# Patient Record
Sex: Male | Born: 1980 | Race: White | Hispanic: No | Marital: Single | Smoking: Never smoker
Health system: Southern US, Academic
[De-identification: ages and names within clinical notes are randomized; demographics above are authoritative.]

## PROBLEM LIST (undated history)

## (undated) DIAGNOSIS — I10 Essential (primary) hypertension: Secondary | ICD-10-CM

## (undated) HISTORY — DX: Essential (primary) hypertension: I10

---
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
## 2022-11-04 ENCOUNTER — Other Ambulatory Visit: Payer: Self-pay

## 2022-11-04 ENCOUNTER — Emergency Department
Admission: EM | Admit: 2022-11-04 | Discharge: 2022-11-04 | Disposition: A | Discharge status: Home / Self Care | Payer: BC Managed Care – PPO | Attending: NURSE PRACTITIONER | Admitting: NURSE PRACTITIONER

## 2022-11-04 ENCOUNTER — Encounter (HOSPITAL_COMMUNITY): Payer: Self-pay

## 2022-11-04 ENCOUNTER — Emergency Department (HOSPITAL_COMMUNITY): Payer: BC Managed Care – PPO

## 2022-11-04 DIAGNOSIS — R109 Unspecified abdominal pain: Secondary | ICD-10-CM | POA: Insufficient documentation

## 2022-11-04 DIAGNOSIS — R1031 Right lower quadrant pain: Secondary | ICD-10-CM

## 2022-11-04 DIAGNOSIS — I10 Essential (primary) hypertension: Secondary | ICD-10-CM | POA: Insufficient documentation

## 2022-11-04 DIAGNOSIS — R1011 Right upper quadrant pain: Secondary | ICD-10-CM

## 2022-11-04 LAB — LACTIC ACID LEVEL W/ REFLEX FOR LEVEL >2.0: LACTIC ACID: 1.4 mmol/L (ref 0.5–2.2)

## 2022-11-04 LAB — BASIC METABOLIC PANEL
ANION GAP: 10 mmol/L (ref 4–13)
BUN/CREA RATIO: 16 (ref 6–22)
BUN: 13 mg/dL (ref 7–25)
CALCIUM: 8.9 mg/dL (ref 8.6–10.3)
CHLORIDE: 105 mmol/L (ref 98–107)
CO2 TOTAL: 24 mmol/L (ref 21–31)
CREATININE: 0.82 mg/dL (ref 0.60–1.30)
ESTIMATED GFR: 113 mL/min/{1.73_m2} (ref >59)
GLUCOSE: 90 mg/dL (ref 74–109)
OSMOLALITY, CALCULATED: 277 mOsm/kg (ref 270–290)
POTASSIUM: 3.9 mmol/L (ref 3.5–5.1)
SODIUM: 139 mmol/L (ref 136–145)

## 2022-11-04 LAB — BLUE TOP TUBE

## 2022-11-04 LAB — CBC WITH DIFF
BASOPHIL #: 0 10*3/uL (ref 0.00–0.10)
BASOPHIL %: 1 % (ref 0–1)
EOSINOPHIL #: 0.4 10*3/uL (ref 0.00–0.50)
EOSINOPHIL %: 4 %
HCT: 41.1 % (ref 36.7–47.1)
HGB: 14.2 g/dL (ref 12.5–16.3)
LYMPHOCYTE #: 4.2 10*3/uL — ABNORMAL HIGH (ref 1.00–3.00)
LYMPHOCYTE %: 42 % (ref 16–44)
MCH: 30.8 pg (ref 23.8–33.4)
MCHC: 34.7 g/dL (ref 32.5–36.3)
MCV: 88.9 fL (ref 73.0–96.2)
MONOCYTE #: 0.9 10*3/uL (ref 0.30–1.00)
MONOCYTE %: 9 % (ref 5–13)
MPV: 8.3 fL (ref 7.4–11.4)
NEUTROPHIL #: 4.6 10*3/uL (ref 1.85–7.80)
NEUTROPHIL %: 45 % (ref 43–77)
PLATELETS: 301 10*3/uL (ref 140–440)
RBC: 4.62 10*6/uL (ref 4.06–5.63)
RDW: 14.3 % (ref 12.1–16.2)
WBC: 10 10*3/uL (ref 3.6–10.2)

## 2022-11-04 LAB — HEPATIC FUNCTION PANEL
ALBUMIN/GLOBULIN RATIO: 1.3 (ref 0.8–1.4)
ALBUMIN: 4.3 g/dL (ref 3.5–5.7)
ALKALINE PHOSPHATASE: 52 U/L (ref 34–104)
ALT (SGPT): 18 U/L (ref 7–52)
AST (SGOT): 14 U/L (ref 13–39)
BILIRUBIN DIRECT: 0.06 md/dL (ref <=0.20)
BILIRUBIN TOTAL: 0.3 mg/dL (ref 0.3–1.2)
BILIRUBIN, INDIRECT: 0.24 mg/dL (ref <=1)
GLOBULIN: 3.2 (ref 2.9–5.4)
PROTEIN TOTAL: 7.5 g/dL (ref 6.4–8.9)

## 2022-11-04 LAB — GRAY TOP TUBE

## 2022-11-04 LAB — LIPASE: LIPASE: 32 U/L (ref 11–82)

## 2022-11-04 MED ORDER — SODIUM CHLORIDE 0.9 % (FLUSH) INJECTION SYRINGE
3.0000 mL | INJECTION | INTRAMUSCULAR | Status: DC | PRN
Start: 2022-11-04 — End: 2022-11-04

## 2022-11-04 MED ORDER — IOHEXOL 350 MG IODINE/ML INTRAVENOUS SOLUTION
100.0000 mL | INTRAVENOUS | Status: AC
Start: 2022-11-04 — End: 2022-11-04
  Administered 2022-11-04: 100 mL via INTRAVENOUS

## 2022-11-04 MED ORDER — SODIUM CHLORIDE 0.9 % (FLUSH) INJECTION SYRINGE
3.0000 mL | INJECTION | Freq: Three times a day (TID) | INTRAMUSCULAR | Status: DC
Start: 2022-11-04 — End: 2022-11-04

## 2022-11-04 MED ORDER — ONDANSETRON HCL (PF) 4 MG/2 ML INJECTION SOLUTION
INTRAMUSCULAR | Status: AC
Start: 2022-11-04 — End: 2022-11-04
  Filled 2022-11-04: qty 2

## 2022-11-04 MED ORDER — KETOROLAC 30 MG/ML (1 ML) INJECTION SOLUTION
INTRAMUSCULAR | Status: AC
Start: 2022-11-04 — End: 2022-11-04
  Filled 2022-11-04: qty 1

## 2022-11-04 MED ORDER — SODIUM CHLORIDE 0.9 % IV BOLUS
1000.0000 mL | INJECTION | Status: AC
Start: 2022-11-04 — End: 2022-11-04
  Administered 2022-11-04: 1000 mL via INTRAVENOUS
  Administered 2022-11-04: 0 mL via INTRAVENOUS

## 2022-11-04 MED ORDER — ONDANSETRON HCL (PF) 4 MG/2 ML INJECTION SOLUTION
4.0000 mg | INTRAMUSCULAR | Status: AC
Start: 2022-11-04 — End: 2022-11-04
  Administered 2022-11-04: 4 mg via INTRAVENOUS

## 2022-11-04 MED ORDER — KETOROLAC 30 MG/ML (1 ML) INJECTION SOLUTION
30.0000 mg | INTRAMUSCULAR | Status: AC
Start: 2022-11-04 — End: 2022-11-04
  Administered 2022-11-04: 30 mg via INTRAVENOUS

## 2022-11-04 MED ORDER — ONDANSETRON 4 MG DISINTEGRATING TABLET
4.0000 mg | ORAL_TABLET | Freq: Three times a day (TID) | ORAL | 0 refills | Status: AC | PRN
Start: 2022-11-04 — End: ?

## 2022-11-04 NOTE — ED Triage Notes (Signed)
Right mid back and Right flank pain x 1 1/2 weeks increasing tonight, "I did have a gallbladder attack here a couple of years ago", denies vomiting, denies hx of kidney stones

## 2022-11-04 NOTE — ED Nurses Note (Signed)
Presents to ER 10 with complains of right flank pain. He states he has had this pain for about a week but tonight it was worse then it has been. Rates pain 8/10.

## 2022-11-04 NOTE — ED Provider Notes (Signed)
[facility]  Emergency Department  Advanced Practice Provider Note      CHIEF COMPLAINT  Chief Complaint   Patient presents with    Flank Pain     HISTORY OF PRESENT ILLNESS  Edwin Byrd, date of birth 1980-08-20, is a 42 y.o. male who presented to the Emergency Department.    Patient is a 42 year old male, with a history of hypertension, who presents to the ER with complaint of right flank pain for the last week but worsening yesterday.  Patient also reporting some nausea but denies any vomiting or diarrhea.  Denies any recent fever, chills.  Denies any dysuria or hematuria.  Patient describes pain as sharp and burning.  Denies any exacerbating or relieving factors.  Rates pain an 8/10.  States he was seen by his PCP earlier this week and told that he had a back strain and was prescribed muscle relaxers which have not helped.    PAST MEDICAL/SURGICAL/FAMILY/SOCIAL HISTORY  Past Medical History:   Diagnosis Date    HTN (hypertension)        Family Medical History:    None       Social History     Socioeconomic History    Marital status: Single   Tobacco Use    Smoking status: Never    Smokeless tobacco: Never   Substance and Sexual Activity    Alcohol use: Never    Drug use: Never      ALLERGIES  No Known Allergies        PHYSICAL EXAM  VITAL SIGNS:  Filed Vitals:    11/04/22 0024   BP: 131/89   Pulse: 82   Resp: 18   Temp: 36.3 C (97.4 F)   SpO2: 97%     Constitutional: Awake. Well appearing. Average body weight. No distress noted.   Cardiovascular: Regular rate. S1, S2 with no murmur or gallop heard. No swelling to extremities  Pulmonary/Chest: Breath sounds clear and equal bilaterally. No wheezes, rales or chest tenderness. No respiratory distress.   Abdominal: Bowel sound normal. Abdomen soft, epigastric/RUQ/RLQ tenderness without guarding.  No CVA tenderness             Musculoskeletal: No tenderness or deformity. Normal muscle tone and strength.   Skin: warm and dry. No rash, redness,  or bruising  Psychiatric: normal mood and affect. Behavior is normal.   Neurological: Alert, oriented. Normal gait. No focal weakness noted. No sensory deficit    Nursing notes reviewed.     DIAGNOSTICS  Labs:  Labs listed below were reviewed and interpreted by me.  Results for orders placed or performed during the hospital encounter of 11/04/22   BASIC METABOLIC PANEL   Result Value Ref Range    SODIUM 139 136 - 145 mmol/L    POTASSIUM 3.9 3.5 - 5.1 mmol/L    CHLORIDE 105 98 - 107 mmol/L    CO2 TOTAL 24 21 - 31 mmol/L    ANION GAP 10 4 - 13 mmol/L    CALCIUM 8.9 8.6 - 10.3 mg/dL    GLUCOSE 90 74 - 956 mg/dL    BUN 13 7 - 25 mg/dL    CREATININE 2.13 0.86 - 1.30 mg/dL    BUN/CREA RATIO 16 6 - 22    ESTIMATED GFR 113 >59 mL/min/1.70m^2    OSMOLALITY, CALCULATED 277 270 - 290 mOsm/kg   HEPATIC FUNCTION PANEL   Result Value Ref Range    ALBUMIN 4.3 3.5 - 5.7 g/dL  ALKALINE PHOSPHATASE 52 34 - 104 U/L    ALT (SGPT) 18 7 - 52 U/L    AST (SGOT) 14 13 - 39 U/L    BILIRUBIN TOTAL 0.3 0.3 - 1.2 mg/dL    BILIRUBIN, INDIRECT 0.24 <=1 mg/dL    PROTEIN TOTAL 7.5 6.4 - 8.9 g/dL    GLOBULIN 3.2 2.9 - 5.4    ALBUMIN/GLOBULIN RATIO 1.3 0.8 - 1.4    BILIRUBIN DIRECT 0.06 <=0.20 md/dL   LIPASE   Result Value Ref Range    LIPASE 32 11 - 82 U/L   CBC WITH DIFF   Result Value Ref Range    WBC 10.0 3.6 - 10.2 x10^3/uL    RBC 4.62 4.06 - 5.63 x10^6/uL    HGB 14.2 12.5 - 16.3 g/dL    HCT 78.4 69.6 - 29.5 %    MCV 88.9 73.0 - 96.2 fL    MCH 30.8 23.8 - 33.4 pg    MCHC 34.7 32.5 - 36.3 g/dL    RDW 28.4 13.2 - 44.0 %    PLATELETS 301 140 - 440 x10^3/uL    MPV 8.3 7.4 - 11.4 fL    NEUTROPHIL % 45 43 - 77 %    LYMPHOCYTE % 42 16 - 44 %    MONOCYTE % 9 5 - 13 %    EOSINOPHIL % 4 %    BASOPHIL % 1 0 - 1 %    NEUTROPHIL # 4.60 1.85 - 7.80 x10^3/uL    LYMPHOCYTE # 4.20 (H) 1.00 - 3.00 x10^3/uL    MONOCYTE # 0.90 0.30 - 1.00 x10^3/uL    EOSINOPHIL # 0.40 0.00 - 0.50 x10^3/uL    BASOPHIL # 0.00 0.00 - 0.10 x10^3/uL   LACTIC ACID LEVEL W/ REFLEX FOR  LEVEL >2.0   Result Value Ref Range    LACTIC ACID 1.4 0.5 - 2.2 mmol/L     Radiology:  Results for orders placed or performed during the hospital encounter of 11/04/22   CT ABDOMEN PELVIS W IV CONTRAST     Status: None    Narrative    Edwin Byrd    RADIOLOGIST: Alvester Chou, MD    CT ABDOMEN PELVIS W IV CONTRAST performed on 11/04/2022 1:47 AM    CLINICAL HISTORY: R flank pain. Nausea.  ruq abd pain    TECHNIQUE:  Abdomen and pelvis CT with intravenous contrast.  IV CONTRAST: 75 ml's of Omnipaque 350    COMPARISON:  04/05/2018    FINDINGS:  Lung bases: Clear    Liver:   Unremarkable.    Gallbladder:   Unremarkable.    Spleen:   Unremarkable.    Pancreas:   Unremarkable.    Adrenals:   Unremarkable.    Kidneys:   Unremarkable.    Bladder:  Unremarkable.  Prostate:  Unremarkable.    Bowel:   Unremarkable.    Appendix:  Normal.    Lymph nodes:  No suspicious lymph node enlargement.    Vasculature:   Major vascular structures are unremarkable.     Peritoneum / Retroperitoneum: No ascites.  No free air.    Bones:   Unremarkable.  Bilateral fat-containing inguinal hernias        Impression    NO ACUTE FINDINGS AT THE ABDOMEN OR PELVIS ON CONTRAST-ENHANCED CT.       One or more dose reduction techniques were used (e.g., Automated exposure control, adjustment of the mA and/or kV according to patient size, use of iterative  reconstruction technique).      Radiologist location ID: [id number]         ED COURSE/MEDICAL DECISION MAKING      ED Course as of 11/04/22 0200   Fri Nov 04, 2022   0107 WBC: 10.0  Normal   0121 BASIC METABOLIC PANEL  Normal   0121 HEPATIC FUNCTION PANEL  Normal   0121 LIPASE: 32  Normal   0133 LACTIC ACID: 1.4  Normal   0159 CT ABDOMEN PELVIS W IV CONTRAST  IMPRESSION:  NO ACUTE FINDINGS AT THE ABDOMEN OR PELVIS ON CONTRAST-ENHANCED CT.         Medical Decision Making  Patient is a 42 year old male, with a history of hypertension, who presents to the ER with complaint of right flank pain for  the last week but worsening yesterday.  Patient also reporting some nausea but denies any vomiting or diarrhea.  Denies any recent fever, chills.  Denies any dysuria or hematuria.  Patient describes pain as sharp and burning.  Denies any exacerbating or relieving factors.  Rates pain an 8/10.  States he was seen by his PCP earlier this week and told that he had a back strain and was prescribed muscle relaxers which have not helped.    Differential dx includes but is not limited to: renal calculi, UTI, pyelonephritis, cholecystitis, appendicitis, pancreatitis    Patient's CBC is unremarkable.  Patient's BNP is unremarkable.  Patient's hepatic function panel is unremarkable.  Patient's lipase is normal.  Patient's CT of the abdomen pelvis shows no acute findings.    Patient reports pain and nausea has improved. Pain has decreased to a 5 or 6. Patient will be discharged with a prescription for Zofran to use as needed.  Patient encouraged to follow up with his PCP tomorrow.  Given strict return to ED precautions    Following the above history, physical exam, and findings- the patient was deemed stable and suitable for discharge.  Discharge and medication instructions were discussed with the patient and all questions were addressed.  The patient understands that they may return to the ED at any time for new or worsening symptoms or if they have any other concerns.  The patient is to follow up with PCP . Patient verbalized understanding and agrees to plan of care     Amount and/or Complexity of Data Reviewed  Radiology: ordered. Decision-making details documented in ED Course.  ECG/medicine tests: independent interpretation performed.    Risk  Prescription drug management.      CLINICAL IMPRESSION  Clinical Impression   Right flank pain (Primary)   Hypertension, unspecified type     DISPOSITION  Discharged       DISCHARGE MEDICATIONS  Current Discharge Medication List        START taking these medications    Details    ondansetron (ZOFRAN ODT) 4 mg Oral Tablet, Rapid Dissolve Take 1 Tablet (4 mg total) by mouth Every 8 hours as needed for Nausea/Vomiting  Qty: 12 Tablet, Refills: 0             Sherlie Ban, FNP-C 11/04/2022, 00:55   [facility]  Department of Emergency Medicine  [facility]    This note was partially generated using MModal Fluency Direct system, and there may be some incorrect words, spellings, and punctuation that were not noted in checking the note before saving.    -----

## 2022-11-04 NOTE — ED Nurses Note (Signed)
Patient discharged home with family.  AVS reviewed with patient/care giver.  A written copy of the AVS and discharge instructions was given to the patient/care giver. Scripts handed to patient/care giver. Questions sufficiently answered as needed.  Patient/care giver encouraged to follow up with PCP as indicated.  In the event of an emergency, patient/care giver instructed to call 911 or go to the nearest emergency room.

## 2022-11-04 NOTE — Discharge Instructions (Signed)
Today in the ED, we completed a workup for your complaint of R side pain. The findings on your exam today and on your blood work and/or imaging is reassuring. Your workup did not show any emergent findings requiring hospitalization.  Sometimes, we do not always find the cause for your symptoms in one ER visit.  At this time, it is not 100% certain what is causing your symptoms, but we feel you can be discharged from the Emergency Department.     It is possible this may worsen or get better. Please, if you get worse or your symptoms change, return to ED. Otherwise, we strongly encourage you to follow up with primary care office within 24-48 hours or any of the specialists we have recommended.     Thank you for allowing us to be part of your care.

## 2022-11-09 ENCOUNTER — Inpatient Hospital Stay
Admission: RE | Admit: 2022-11-09 | Discharge: 2022-11-09 | Disposition: A | Discharge status: Home / Self Care | Payer: BC Managed Care – PPO | Source: Ambulatory Visit | Attending: FAMILY PRACTICE | Admitting: FAMILY PRACTICE

## 2022-11-09 ENCOUNTER — Other Ambulatory Visit (HOSPITAL_COMMUNITY): Payer: Self-pay | Admitting: FAMILY PRACTICE

## 2022-11-09 ENCOUNTER — Other Ambulatory Visit: Payer: Self-pay

## 2022-11-09 DIAGNOSIS — S161XXA Strain of muscle, fascia and tendon at neck level, initial encounter: Secondary | ICD-10-CM

## 2022-12-06 IMAGING — MR MRI LUMBAR SPINE WITHOUT CONTRAST
5 of 6 series · 32 of 48 positions shown · IV contrast (gadolinium)
Comparison: None available.

﻿EXAM:  [id number]   MRI LUMBAR SPINE WITHOUT CONTRAST
INDICATION: Persistent low back pain. Radiculopathy to the left side. No history of back surgery or malignancy.
TECHNIQUE: Multiplanar, multisequential MRI of the lumbosacral spine was performed without gadolinium contrast.

[Series 6: T1 · sagittal · 4.0mm · 0.94mm/px · 6 of 13 slices shown (1 of 2)]
[im 1/13]
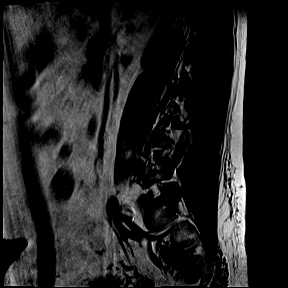
[im 3/13]
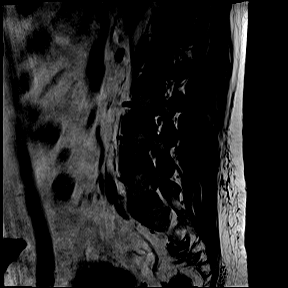
[im 5/13]
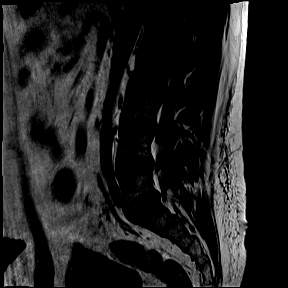
[im 8/13]
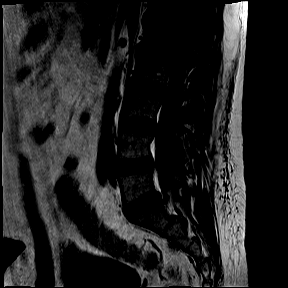
[im 10/13]
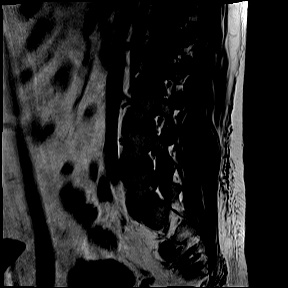
[im 13/13]
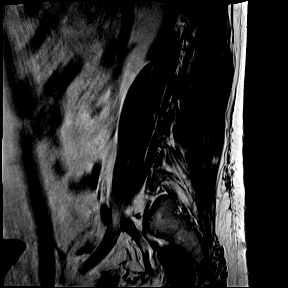

[Series 7: T2 · sagittal · 4.0mm · 0.94mm/px · 6 of 13 slices shown (1 of 3)]
[im 1/13]
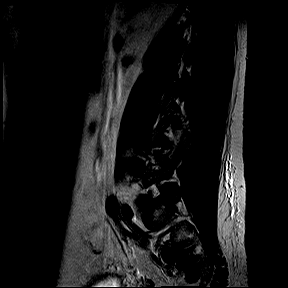
[im 3/13]
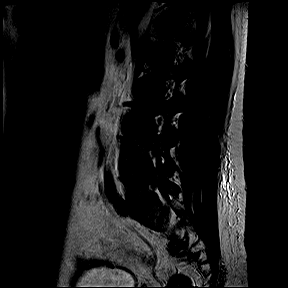
[im 5/13]
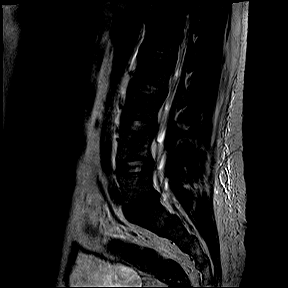
[im 8/13]
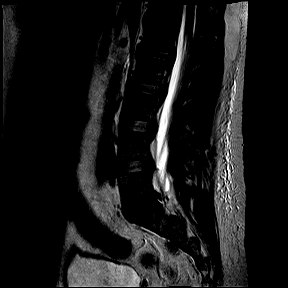
[im 10/13]
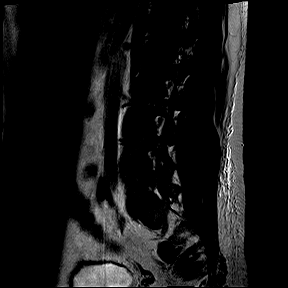
[im 13/13]
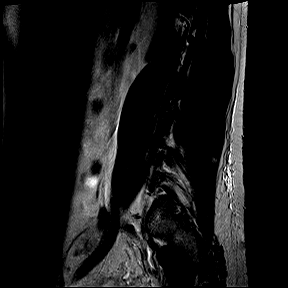

[Series 9: T2 · coronal · 5.0mm · 0.82mm/px · 8 of 18 slices shown (2 of 3)]
[im 1/18]
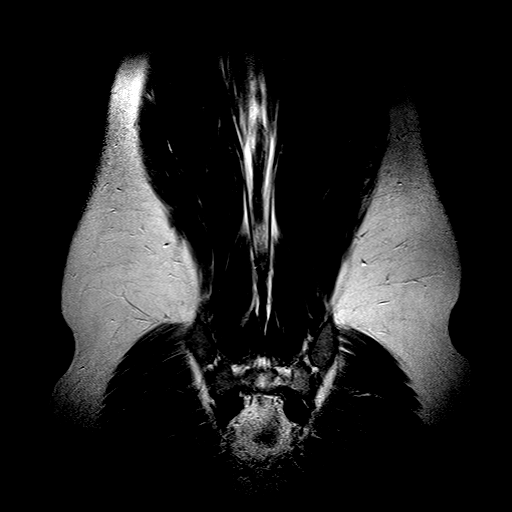
[im 3/18]
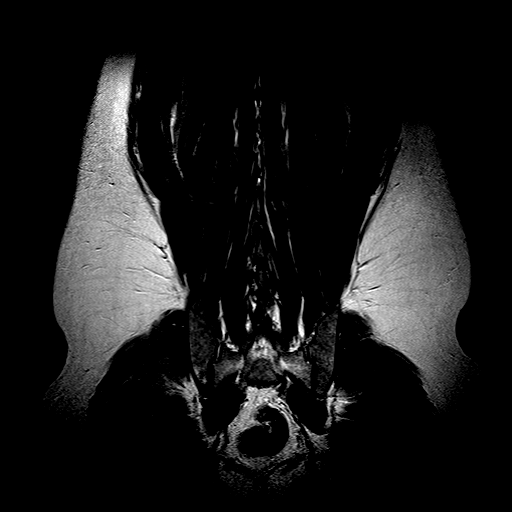
[im 5/18]
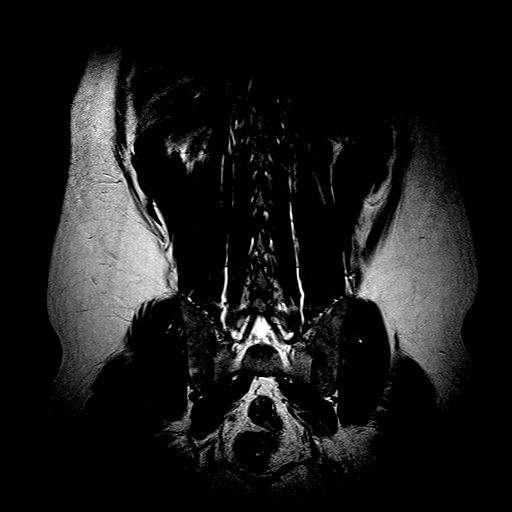
[im 8/18]
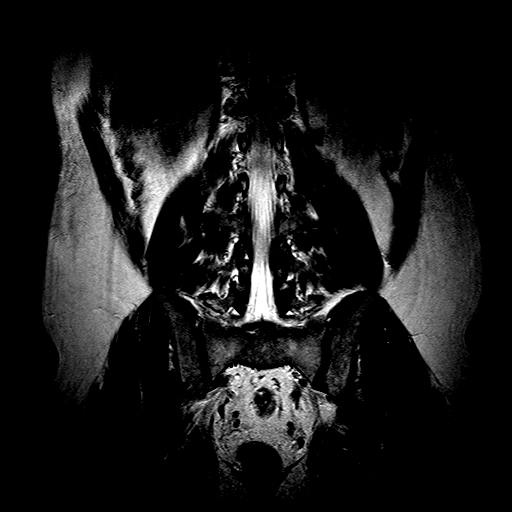
[im 10/18]
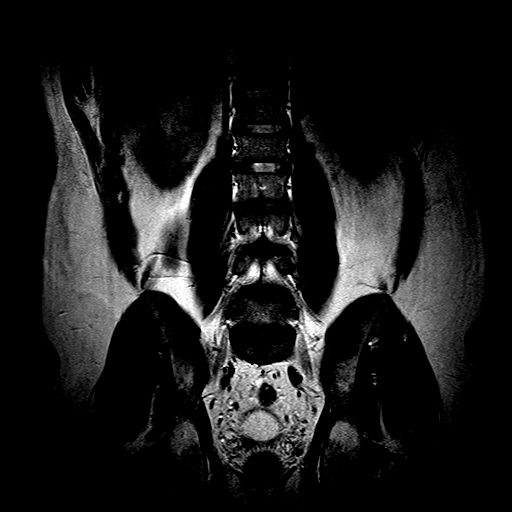
[im 13/18]
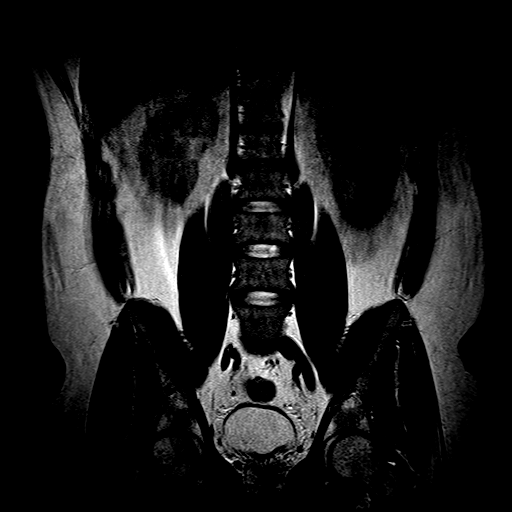
[im 15/18]
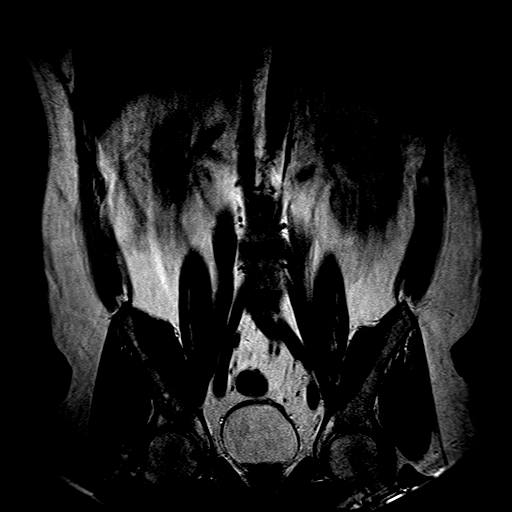
[im 18/18]
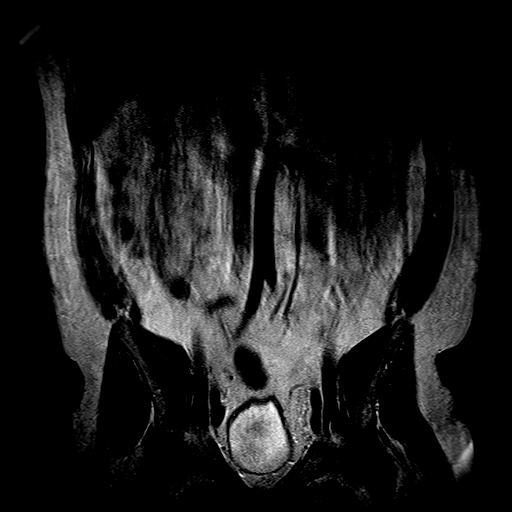

[Series 10: T2 · axial · 4.0mm · 0.52mm/px · z∈[-86,+126]mm · 11 of 23 slices shown (3 of 3)]
[im 1/23]
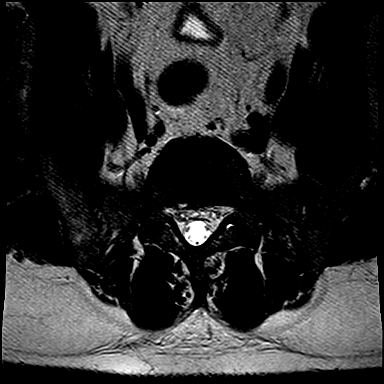
[im 3/23]
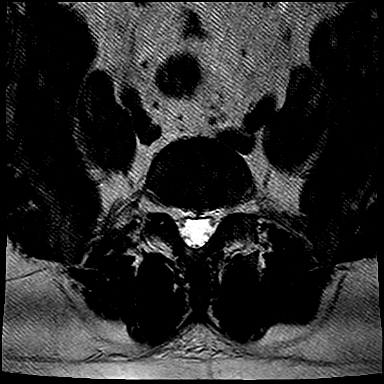
[im 5/23]
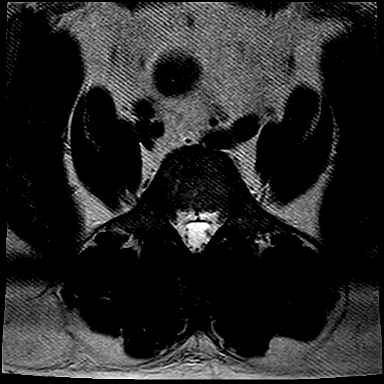
[im 7/23]
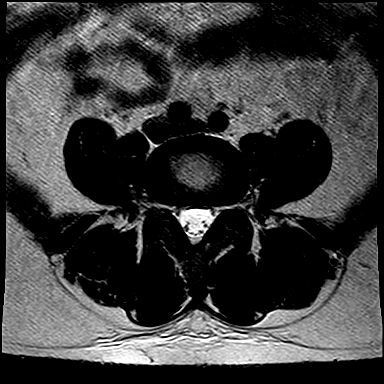
[im 9/23]
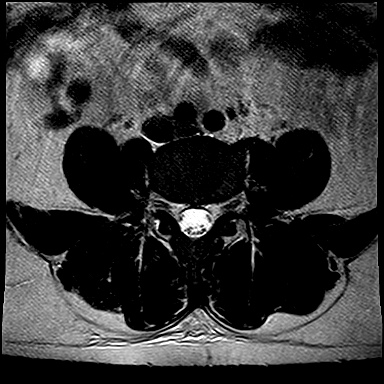
[im 12/23]
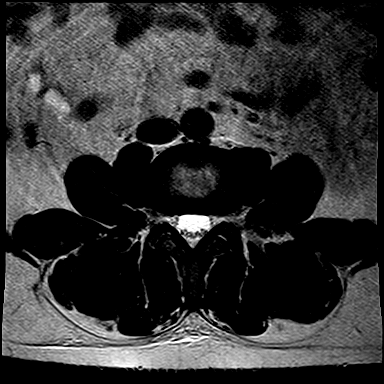
[im 14/23]
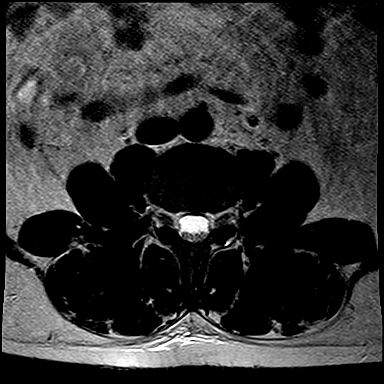
[im 16/23]
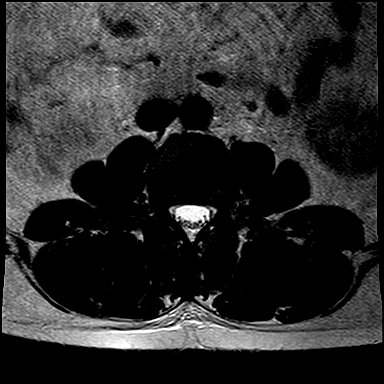
[im 18/23]
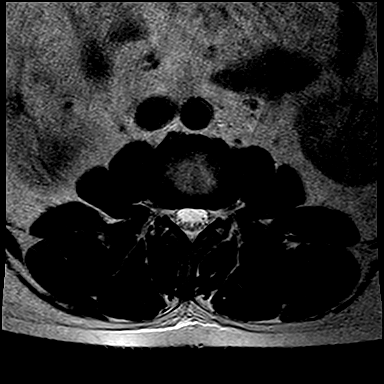
[im 20/23]
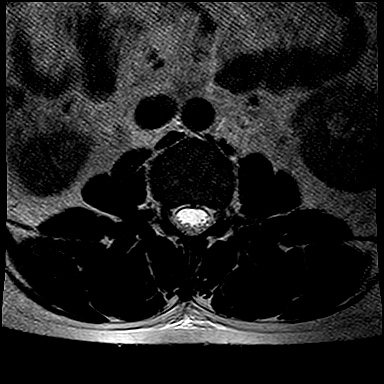
[im 23/23]
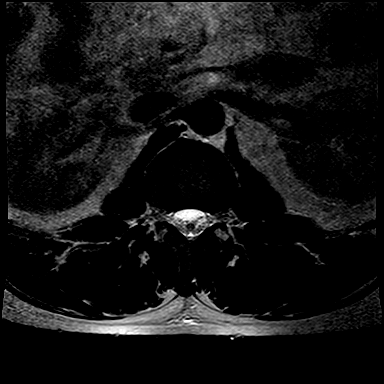

[Series 11: T1 · axial · 4.0mm · 0.52mm/px · 1 of 23 slices shown (2 of 2)]
[im 1/23]
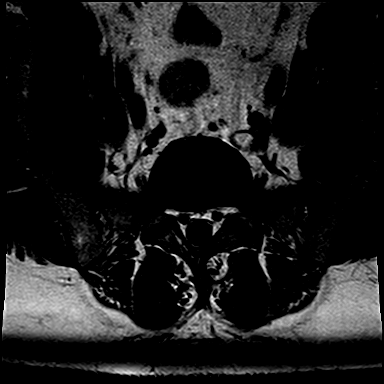

[32 of 48 positions shown; findings below may reference images not displayed]

FINDINGS: No acute bony lesions of lumbar vertebrae.  Conus medullaris and cauda equina structures are normal in the sagittal projection.

L1-2 disc shows no focal abnormalities.  L2-3 disc shows no focal abnormalities.  No significant focal lesions of L3-4 disc. Mild facet arthropathy at this level is noted causing minimal compromise of both lateral recess. 

At L4-5 level, bilateral facet arthropathy of significant degree is noted along with mild bulging annulus causing mild compromise of both lateral recess and neural foramina. 

At L5-S1 level, significant degenerative disc disease with focal bulging annulus and annulus tear in the midline minimally impinging on thecal sac in the midline. Bulging annulus and facet arthropathy are causing moderate biforaminal narrowing at this level, right more than the left.

Paravertebral soft tissues are unremarkable.
IMPRESSION: 1. No acute bony lesions of lumbar vertebrae. 

2. At L5-S1 level, significant degenerative disc disease with focal bulging annulus and annulus tear in the midline minimally impinging on thecal sac in the midline. Bulging annulus and facet arthropathy are causing moderate biforaminal narrowing at this level, right more than the left.

3. Findings at other disc levels are described above in detail.

## 2023-02-02 IMAGING — MR MRI CERVICAL SPINE WITHOUT CONTRAST
4 of 5 series · 23 of 48 positions shown · IV contrast (gadolinium)
Comparison: None available.

﻿EXAM:  [id number]   MRI CERVICAL SPINE WITHOUT CONTRAST
INDICATION: Neck pain.  Radicular symptoms to left shoulder.
TECHNIQUE: Multiplanar, multisequential MRI of the C-spine was performed without gadolinium contrast.

[Series 5: T2 · sagittal · 3.0mm · 0.75mm/px · 8 of 13 slices shown (1 of 2)]
[im 1/13]
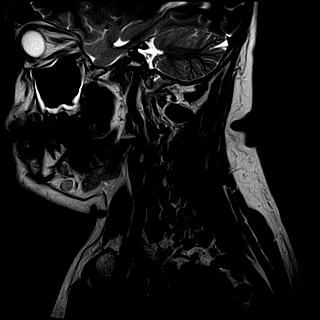
[im 2/13]
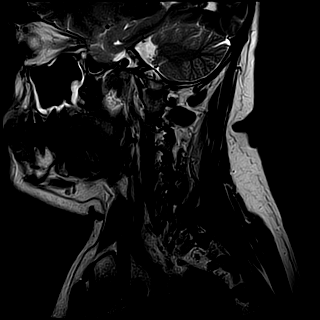
[im 4/13]
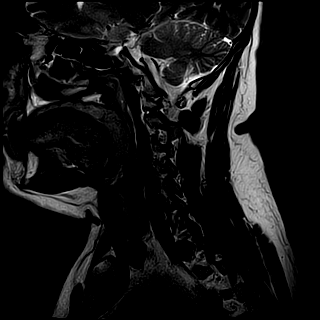
[im 6/13]
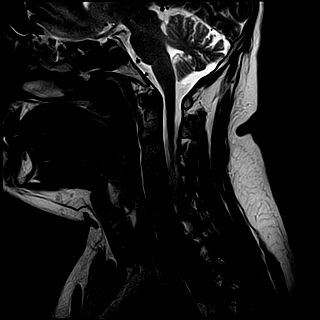
[im 7/13]
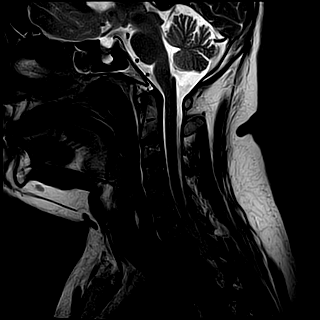
[im 9/13]
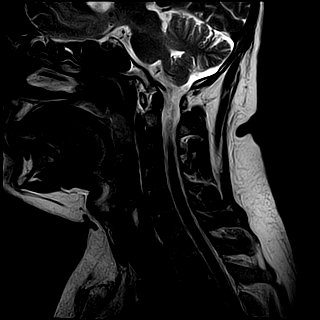
[im 11/13]
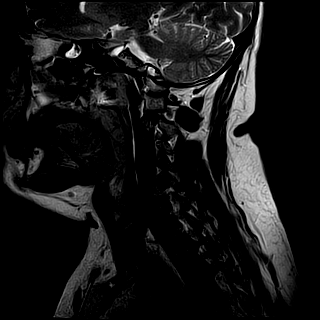
[im 13/13]
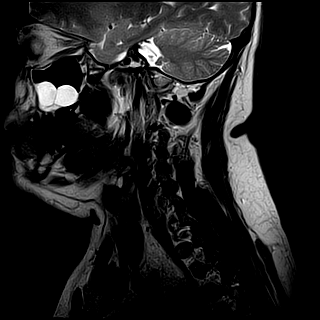

[Series 6: T1 · sagittal · 3.0mm · 0.47mm/px · 3 of 13 slices shown]
[im 2/13]
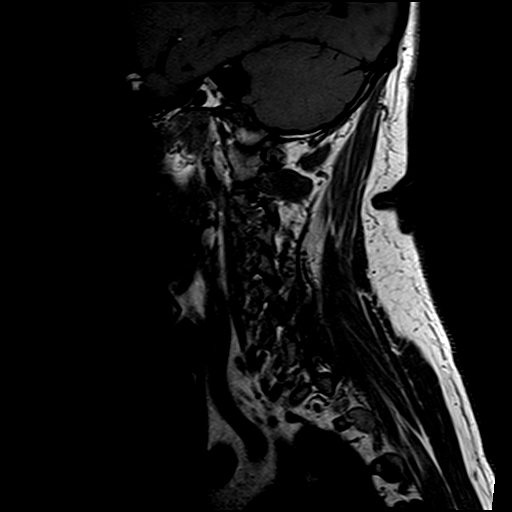
[im 7/13]
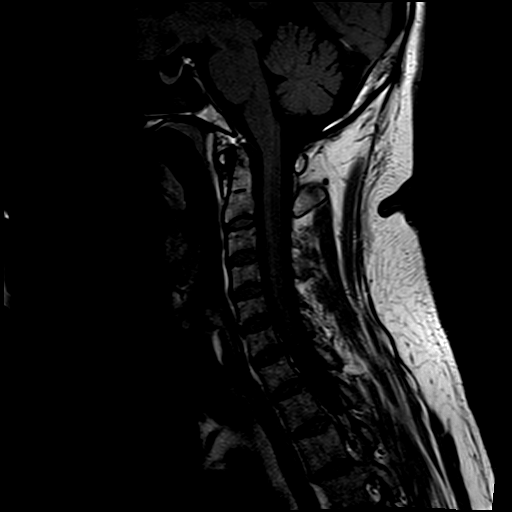
[im 11/13]
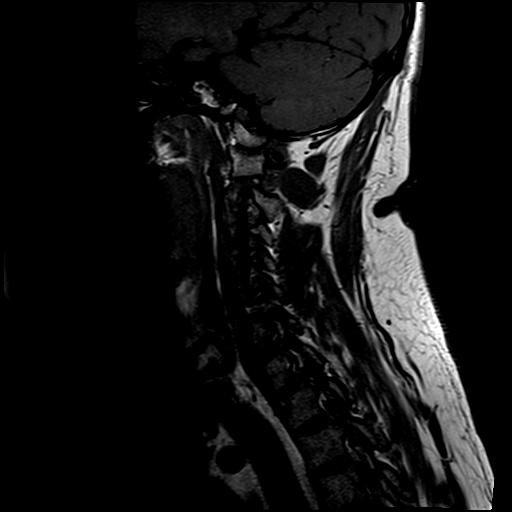

[Series 7: STIR · sagittal · 3.0mm · 0.47mm/px · 3 of 13 slices shown]
[im 2/13]
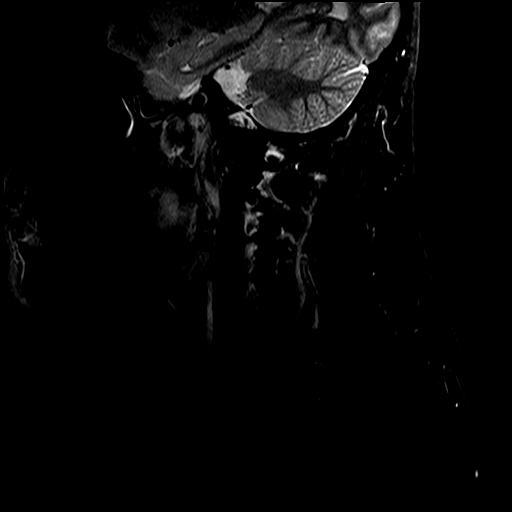
[im 7/13]
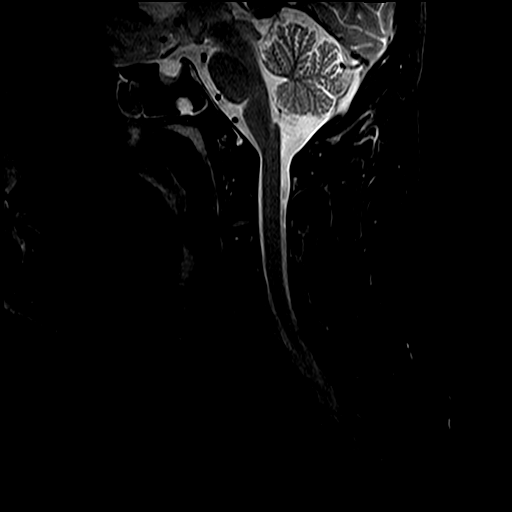
[im 11/13]
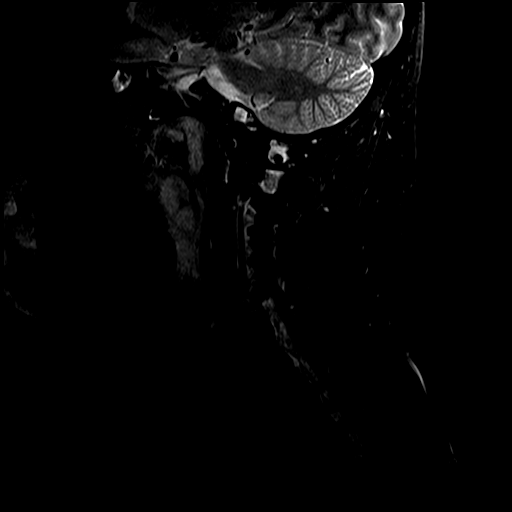

[Series 9: T2 · axial · 3.0mm · 0.39mm/px · z∈[-122,-15]mm · 9 of 18 slices shown (2 of 2)]
[im 1/18]
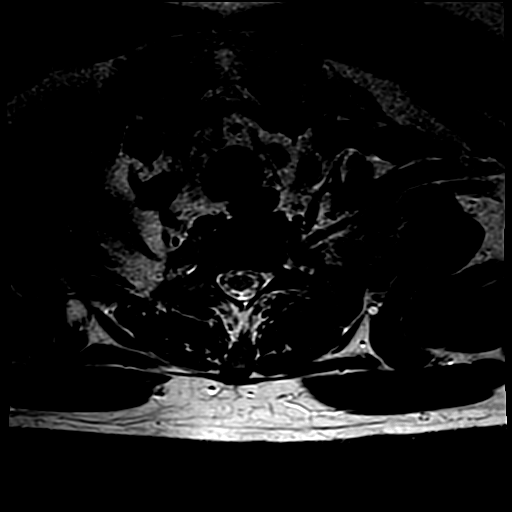
[im 4/18]
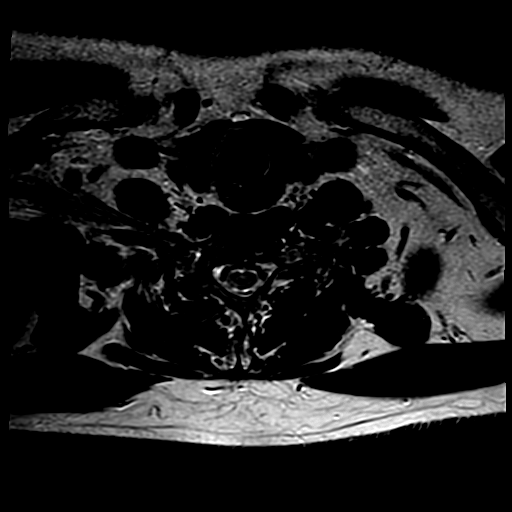
[im 5/18]
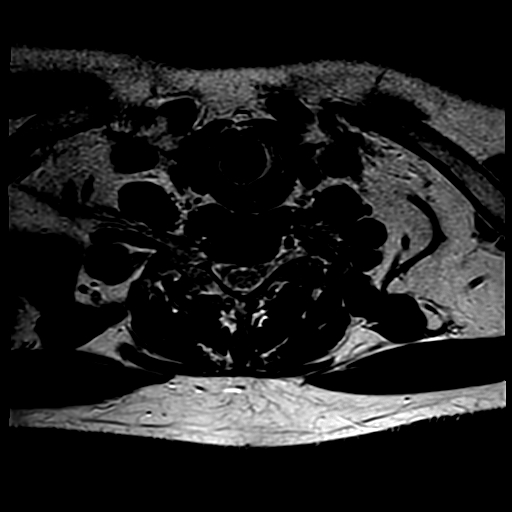
[im 8/18]
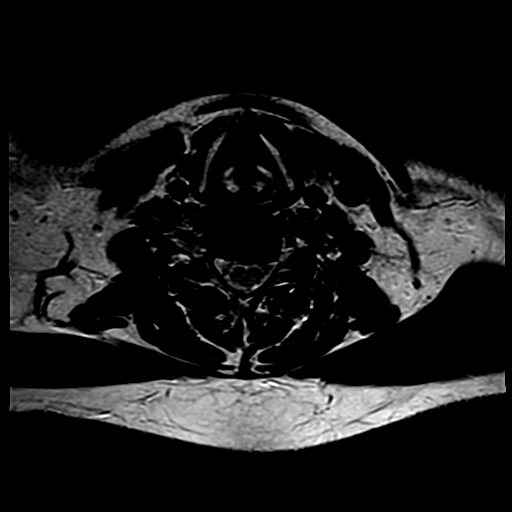
[im 10/18]
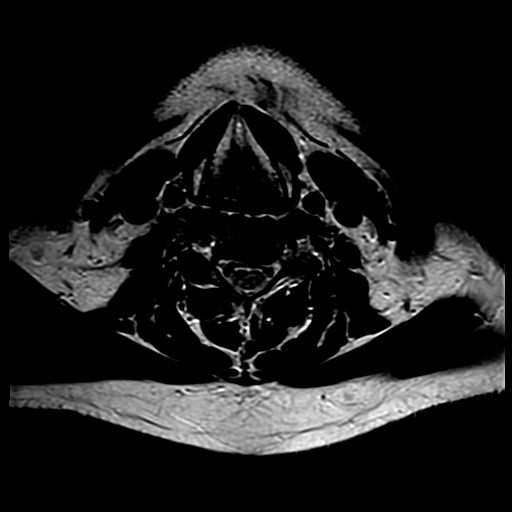
[im 13/18]
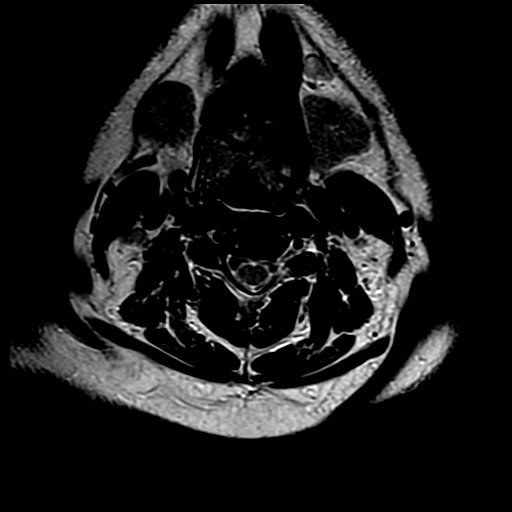
[im 14/18]
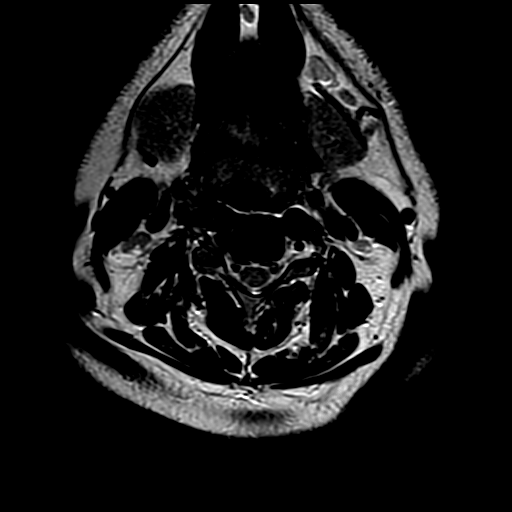
[im 16/18]
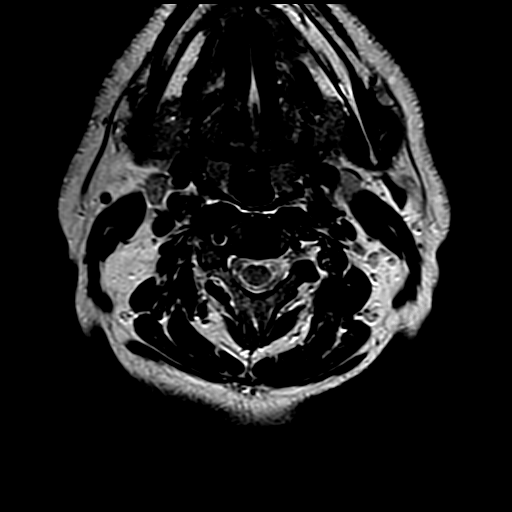
[im 18/18]
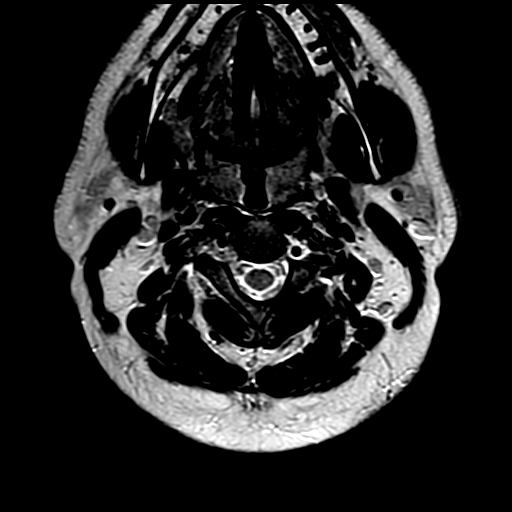

[23 of 48 positions shown; findings below may reference images not displayed]

FINDINGS: No focal bone changes of cervical vertebrae.  No focal abnormalities are seen at the foramina magnum in the sagittal view. 

At C2-3 disc level, no focal disc lesions are seen.

At C3-4 level, mild degenerative disc changes with bulging annulus causing mild right foraminal narrowing.

At C4-5 level, degenerative disc disease is noted with bulging annulus causing mild to moderate biforaminal narrowing. Tiny central disc protrusion without compromise of thecal sac in the midline.

At C5-6 level, degenerative disc disease with tiny central disc protrusion without compromise of thecal sac.  Bulging annulus is causing mild to moderate biforaminal narrowing at this level.

C6-7 disc shows tiny annulus tear and bulging annulus in the midline.  No significant compromise of thecal sac or neural foramina is seen. C7-T1 disc is normal.

Cervical spinal cord shows no focal lesions.
IMPRESSION: 1. No acute bony lesions of cervical vertebrae. 

2. Mild degenerative changes at multiple levels as described above in detail.

3. Cervical spinal cord shows no focal abnormalities.

4. Incidental finding of mucoperiosteal inflammatory changes of sinuses particularly maxillary sinuses.
